# Patient Record
Sex: Female | Born: 1980 | Hispanic: Yes
Health system: Southern US, Community
[De-identification: ages and names within clinical notes are randomized; demographics above are authoritative.]

---

## 2019-07-12 ENCOUNTER — Emergency Department (HOSPITAL_COMMUNITY): Payer: Self-pay

## 2019-07-12 ENCOUNTER — Other Ambulatory Visit: Payer: Self-pay

## 2019-07-12 ENCOUNTER — Emergency Department (HOSPITAL_COMMUNITY)
Admission: EM | Admit: 2019-07-12 | Discharge: 2019-07-12 | Disposition: A | Discharge status: Home / Self Care | Payer: Self-pay | Attending: Emergency Medicine | Admitting: Emergency Medicine

## 2019-07-12 DIAGNOSIS — J1282 Pneumonia due to coronavirus disease 2019: Secondary | ICD-10-CM

## 2019-07-12 DIAGNOSIS — U071 COVID-19: Secondary | ICD-10-CM | POA: Insufficient documentation

## 2019-07-12 DIAGNOSIS — R509 Fever, unspecified: Secondary | ICD-10-CM | POA: Insufficient documentation

## 2019-07-12 DIAGNOSIS — R531 Weakness: Secondary | ICD-10-CM | POA: Insufficient documentation

## 2019-07-12 DIAGNOSIS — J1289 Other viral pneumonia: Secondary | ICD-10-CM | POA: Insufficient documentation

## 2019-07-12 DIAGNOSIS — R0602 Shortness of breath: Secondary | ICD-10-CM | POA: Insufficient documentation

## 2019-07-12 LAB — COMPREHENSIVE METABOLIC PANEL
ALT: 27 U/L (ref 0–44)
AST: 37 U/L (ref 15–41)
Albumin: 3.7 g/dL (ref 3.5–5.0)
Alkaline Phosphatase: 69 U/L (ref 38–126)
Anion gap: 10 (ref 5–15)
BUN: 7 mg/dL (ref 6–20)
CO2: 22 mmol/L (ref 22–32)
Calcium: 8.4 mg/dL — ABNORMAL LOW (ref 8.9–10.3)
Chloride: 105 mmol/L (ref 98–111)
Creatinine, Ser: 0.54 mg/dL (ref 0.44–1.00)
GFR calc Af Amer: >60 mL/min (ref >60)
GFR calc non Af Amer: >60 mL/min (ref >60)
Glucose, Bld: 107 mg/dL — ABNORMAL HIGH (ref 70–99)
Potassium: 3.5 mmol/L (ref 3.5–5.1)
Sodium: 137 mmol/L (ref 135–145)
Total Bilirubin: 0.7 mg/dL (ref 0.3–1.2)
Total Protein: 7.7 g/dL (ref 6.5–8.1)

## 2019-07-12 LAB — CBC WITH DIFFERENTIAL/PLATELET
Abs Immature Granulocytes: 0.03 10*3/uL (ref 0.00–0.07)
Basophils Absolute: 0 10*3/uL (ref 0.0–0.1)
Basophils Relative: 0 %
Eosinophils Absolute: 0 10*3/uL (ref 0.0–0.5)
Eosinophils Relative: 0 %
HCT: 39.4 % (ref 36.0–46.0)
Hemoglobin: 12.9 g/dL (ref 12.0–15.0)
Immature Granulocytes: 0 %
Lymphocytes Relative: 8 %
Lymphs Abs: 0.7 10*3/uL (ref 0.7–4.0)
MCH: 28.9 pg (ref 26.0–34.0)
MCHC: 32.7 g/dL (ref 30.0–36.0)
MCV: 88.3 fL (ref 80.0–100.0)
Monocytes Absolute: 0.3 10*3/uL (ref 0.1–1.0)
Monocytes Relative: 3 %
Neutro Abs: 8.4 10*3/uL — ABNORMAL HIGH (ref 1.7–7.7)
Neutrophils Relative %: 89 %
Platelets: 253 10*3/uL (ref 150–400)
RBC: 4.46 MIL/uL (ref 3.87–5.11)
RDW: 13 % (ref 11.5–15.5)
WBC: 9.5 10*3/uL (ref 4.0–10.5)
nRBC: 0 % (ref 0.0–0.2)

## 2019-07-12 LAB — URINALYSIS, ROUTINE W REFLEX MICROSCOPIC
Bilirubin Urine: NEGATIVE
Glucose, UA: NEGATIVE mg/dL
Hgb urine dipstick: NEGATIVE
Ketones, ur: 5 mg/dL — AB
Leukocytes,Ua: NEGATIVE
Nitrite: NEGATIVE
Protein, ur: NEGATIVE mg/dL
Specific Gravity, Urine: 1.006 (ref 1.005–1.030)
pH: 7 (ref 5.0–8.0)

## 2019-07-12 LAB — POC SARS CORONAVIRUS 2 AG -  ED: SARS Coronavirus 2 Ag: NEGATIVE

## 2019-07-12 LAB — I-STAT BETA HCG BLOOD, ED (MC, WL, AP ONLY): I-stat hCG, quantitative: <5 m[IU]/mL (ref <5)

## 2019-07-12 MED ORDER — SODIUM CHLORIDE 0.9 % IV BOLUS
500.0000 mL | Freq: Once | INTRAVENOUS | Status: AC
  Administered 2019-07-12: 500 mL via INTRAVENOUS

## 2019-07-12 MED ORDER — ALBUTEROL SULFATE HFA 108 (90 BASE) MCG/ACT IN AERS
2.0000 | INHALATION_SPRAY | Freq: Once | RESPIRATORY_TRACT | Status: AC
  Administered 2019-07-12: 23:00:00 2 via RESPIRATORY_TRACT
  Filled 2019-07-12: qty 6.7

## 2019-07-12 MED ORDER — ACETAMINOPHEN 500 MG PO TABS
1000.0000 mg | ORAL_TABLET | Freq: Once | ORAL | Status: AC
  Administered 2019-07-12: 1000 mg via ORAL
  Filled 2019-07-12: qty 2

## 2019-07-12 MED ORDER — PROMETHAZINE-DM 6.25-15 MG/5ML PO SYRP: 5.0000 mL | ORAL_SOLUTION | Freq: Four times a day (QID) | ORAL | 0 refills | Status: AC | PRN

## 2019-07-12 NOTE — ED Notes (Addendum)
Patients SPO2 remained 92% RA while ambulating in room. Patients HR remained 120-130.

## 2019-07-12 NOTE — Discharge Instructions (Addendum)
Toma tylenol e ibuprofen por dolor o fiebre. [place] el albuterol cada 4 horas por los 2 dias que siegue. Despues, [place] solo is necesita por ataques de tos or dificultad con respiracion.  Toma el liquido por tos si necesita.  Continua tomar el amoxicillin  Da Ardelia Mems cita con el doctor para Engineer, civil (consulting).  Regreasa a la sala de emergencia si tiene mas dificultad con respiracion. Regresa con nuevas sintomas o sintomas empeorando.

## 2019-07-12 NOTE — ED Notes (Signed)
Patient was verbalized discharge instructions. Pt had no further questions at this time. NAD. Interpreter used Shirlee Limerick [phone]

## 2019-07-12 NOTE — ED Notes (Signed)
Interpreter used: [id number] Opal Sidles

## 2019-07-12 NOTE — ED Triage Notes (Signed)
Per EMS: Pt is coming from home with c/o severe SOB, cough, and fever. Pt tested negative for covid on 07/09/19 but reports having a covid exposure. Patients lower lobes are diminished and feels hot to touch. Pt denies N/V/D and denies pain at this time.    EMS VITALS: BP 132/88 HR 120 SPO2 91% RA SPO2 97% 3L

## 2019-07-12 NOTE — ED Notes (Signed)
Pt was verbalized discharge instructions. Pt had no further questions at this time. NAD. 

## 2019-07-13 LAB — CBG MONITORING, ED: Glucose-Capillary: 105 mg/dL — ABNORMAL HIGH (ref 70–99)

## 2019-07-13 NOTE — ED Provider Notes (Signed)
[facility]-EMERGENCY DEPT Provider Note   CSN: [id number] Arrival date & time: 07/12/19  1912     History No chief complaint on file.   Alyssa Gallagher is a 38 y.o. female presenting for evaluation of cough and generalized weakness.  Patient states that the past week, she has not been feeling well.  Patient states she has been coughing a lot, and when she has coughing attacks she gets short of breath.  She has also had gradually worsening weakness.  She reports fevers.  Patient states she was tested for coronavirus 2 days ago, was positive.  She denies sick contacts.  Patient states she was also found to have pneumonia, started on amoxicillin 3 days ago.  She was given cough drops for cough, but this has not helped.  She is not taking any other medicine including Tylenol or ibuprofen.  She denies headache, vision changes, sore throat, ear pain, chest pain, shortness of breath when not coughing, nausea, vomiting, abdominal pain, urinary symptoms, abnormal bowel movements.  She denies leg pain or swelling.  She has no other medical problems, takes no medications daily.  HPI     No past medical history on file.  There are no problems to display for this patient.     OB History   No obstetric history on file.     No family history on file.  Social History   Tobacco Use  . Smoking status: Not on file  Substance Use Topics  . Alcohol use: Not on file  . Drug use: Not on file    Home Medications Prior to Admission medications   Medication Sig Start Date End Date Taking? Authorizing Provider  promethazine-dextromethorphan (PROMETHAZINE-DM) 6.25-15 MG/5ML syrup Take 5 mLs by mouth 4 (four) times daily as needed for cough. 07/12/19   Adessa Primiano, PA-C    Allergies    Patient has no known allergies.  Review of Systems   Review of Systems  Constitutional: Positive for fever.  Respiratory: Positive for cough and shortness of breath  (When coughing).   Neurological: Positive for weakness.  All other systems reviewed and are negative.   Physical Exam Updated Vital Signs BP 118/80 (BP Location: Left Arm)   Pulse 99   Temp 100.1 F (37.8 C) (Oral)   Resp 19   SpO2 93%   Physical Exam Vitals and nursing note reviewed.  Constitutional:      General: She is not in acute distress.    Appearance: She is well-developed. She is ill-appearing.     Comments: Appears as if she feels ill, but in no acute distress  HENT:     Head: Normocephalic and atraumatic.  Eyes:     Extraocular Movements: Extraocular movements intact.     Conjunctiva/sclera: Conjunctivae normal.     Pupils: Pupils are equal, round, and reactive to light.  Cardiovascular:     Rate and Rhythm: Regular rhythm. Tachycardia present.     Comments: Tachycardic around 120 Pulmonary:     Effort: Pulmonary effort is normal. No respiratory distress.     Breath sounds: Normal breath sounds. No wheezing.     Comments: Coarse lung sounds, worse on the right side.  Sats stable on room air.  No signs of respiratory distress or accessory muscle use. Abdominal:     General: There is no distension.     Palpations: Abdomen is soft. There is no mass.     Tenderness: There is no abdominal tenderness. There is  no guarding or rebound.  Musculoskeletal:        General: Normal range of motion.     Cervical back: Normal range of motion and neck supple.     Right lower leg: No edema.     Left lower leg: No edema.  Skin:    General: Skin is warm and dry.     Capillary Refill: Capillary refill takes less than 2 seconds.  Neurological:     Mental Status: She is alert and oriented to person, place, and time.     ED Results / Procedures / Treatments   Labs (all labs ordered are listed, but only abnormal results are displayed) Labs Reviewed  CBC WITH DIFFERENTIAL/PLATELET - Abnormal; Notable for the following components:      Result Value   Neutro Abs 8.4 (*)    All  other components within normal limits  COMPREHENSIVE METABOLIC PANEL - Abnormal; Notable for the following components:   Glucose, Bld 107 (*)    Calcium 8.4 (*)    All other components within normal limits  URINALYSIS, ROUTINE W REFLEX MICROSCOPIC - Abnormal; Notable for the following components:   Ketones, ur 5 (*)    All other components within normal limits  CBG MONITORING, ED - Abnormal; Notable for the following components:   Glucose-Capillary 105 (*)    All other components within normal limits  I-STAT BETA HCG BLOOD, ED ([facility], [facility], [facility] ONLY)  POC SARS CORONAVIRUS 2 AG -  ED    EKG None  Radiology DG Chest Portable 1 View  Result Date: 07/12/2019 CLINICAL DATA:  Shortness of breath EXAM: PORTABLE CHEST 1 VIEW COMPARISON:  None. FINDINGS: The heart size and mediastinal contours are within normal limits patchy airspace disease in seen within the right upper lung and periphery of the left upper lung. No pleural effusion. Overall shallow degree of aeration. No acute osseous abnormality. IMPRESSION: Bilateral patchy airspace opacities, left greater than right. This could be due to multifocal pneumonia. Electronically Signed   By: Jonna Clark M.D.   On: 07/12/2019 20:40    Procedures Procedures (including critical care time)  Medications Ordered in ED Medications  acetaminophen (TYLENOL) tablet 1,000 mg (1,000 mg Oral Given 07/12/19 2033)  sodium chloride 0.9 % bolus 500 mL (0 mLs Intravenous Stopped 07/12/19 2113)  albuterol (VENTOLIN HFA) 108 (90 Base) MCG/ACT inhaler 2 puff (2 puffs Inhalation Given 07/12/19 2303)    ED Course  I have reviewed the triage vital signs and the nursing notes.  Pertinent labs & imaging results that were available during my care of the patient were reviewed by me and considered in my medical decision making (see chart for details).    MDM Rules/Calculators/A&P                      Patient presenting for evaluation of cough, shortness of breath,  and generalized weakness.  Physical exam shows patient appears ill, but nontoxic.  She is febrile at 102.6, likely contributing to her tachycardia.  However her sats are stable on room air.  I do not anticipate that she will need admission.  As such, will will hold on calling code sepsis, especially as her symptoms are likely due to coronavirus.  Will obtain labs, chest x-ray, treat fever and reassess.  Labs overall reassuring, no leukocytosis.  Electrolytes stable.  Chest x-ray viewed interpreted by me, shows multifocal pneumonia.  This is likely due to coronavirus.  Rapid Covid was negative, however as  patient continues to have symptoms, will continue to follow coronavirus precautions. Heart rate improved with antipyretics and fluids.  Patient ambulated, did not become hypoxic.  Did become tachycardic at 1 20-1 30.  On reassessment, patient reports she is feeling better.  I discussed patient's tachycardia with ambulation, discussed option of admission if she feels she is not safe to go home due to weakness and tachycardia.  Patient states she would like to go home and continue to treat symptomatically.  She is mostly concerned about her coughing fits.  Will give inhaler for bronchospasm and cough syrup as needed.  Will have patient continue amoxicillin due to multifocal pneumonia read on x-ray, though this is likely viral in nature.  Will have patient follow-up closely with her primary care doctor.  Case discussed with attending, Dr. Alvino Chapel and agrees to plan.  Strict return precautions given, including any worsening respiratory status.  At this time, patient appears safe for discharge.  Patient states she understands and agrees to plan.  Alyssa Gallagher was evaluated in Emergency Department on 07/13/2019 for the symptoms described in the history of present illness. She was evaluated in the context of the global COVID-19 pandemic, which necessitated consideration that the patient might be  at risk for infection with the SARS-CoV-2 virus that causes COVID-19. Institutional protocols and algorithms that pertain to the evaluation of patients at risk for COVID-19 are in a state of rapid change based on information released by regulatory bodies including the CDC and federal and state organizations. These policies and algorithms were followed during the patient's care in the ED.   Final Clinical Impression(s) / ED Diagnoses Final diagnoses:  COVID-19  Pneumonia due to COVID-19 virus    Rx / DC Orders ED Discharge Orders         Ordered    promethazine-dextromethorphan (PROMETHAZINE-DM) 6.25-15 MG/5ML syrup  4 times daily PRN     07/12/19 2254           Franchot Heidelberg, PA-C 07/13/19 0106    Davonna Belling, MD 07/16/19 0800

## 2020-03-05 ENCOUNTER — Other Ambulatory Visit: Payer: Self-pay | Admitting: Nurse Practitioner

## 2020-03-05 ENCOUNTER — Ambulatory Visit
Admission: RE | Admit: 2020-03-05 | Discharge: 2020-03-05 | Disposition: A | Discharge status: Home / Self Care | Payer: No Typology Code available for payment source | Source: Ambulatory Visit | Attending: Nurse Practitioner | Admitting: Nurse Practitioner

## 2020-03-05 DIAGNOSIS — M25472 Effusion, left ankle: Secondary | ICD-10-CM

## 2021-10-14 IMAGING — CR DG ANKLE 2V *L*
2 series · 2 of 2 positions shown · non-contrast
Comparison: None.

CLINICAL DATA: Soft tissue swelling

EXAM:
LEFT ANKLE - 2 VIEW

[x ankle ap left]
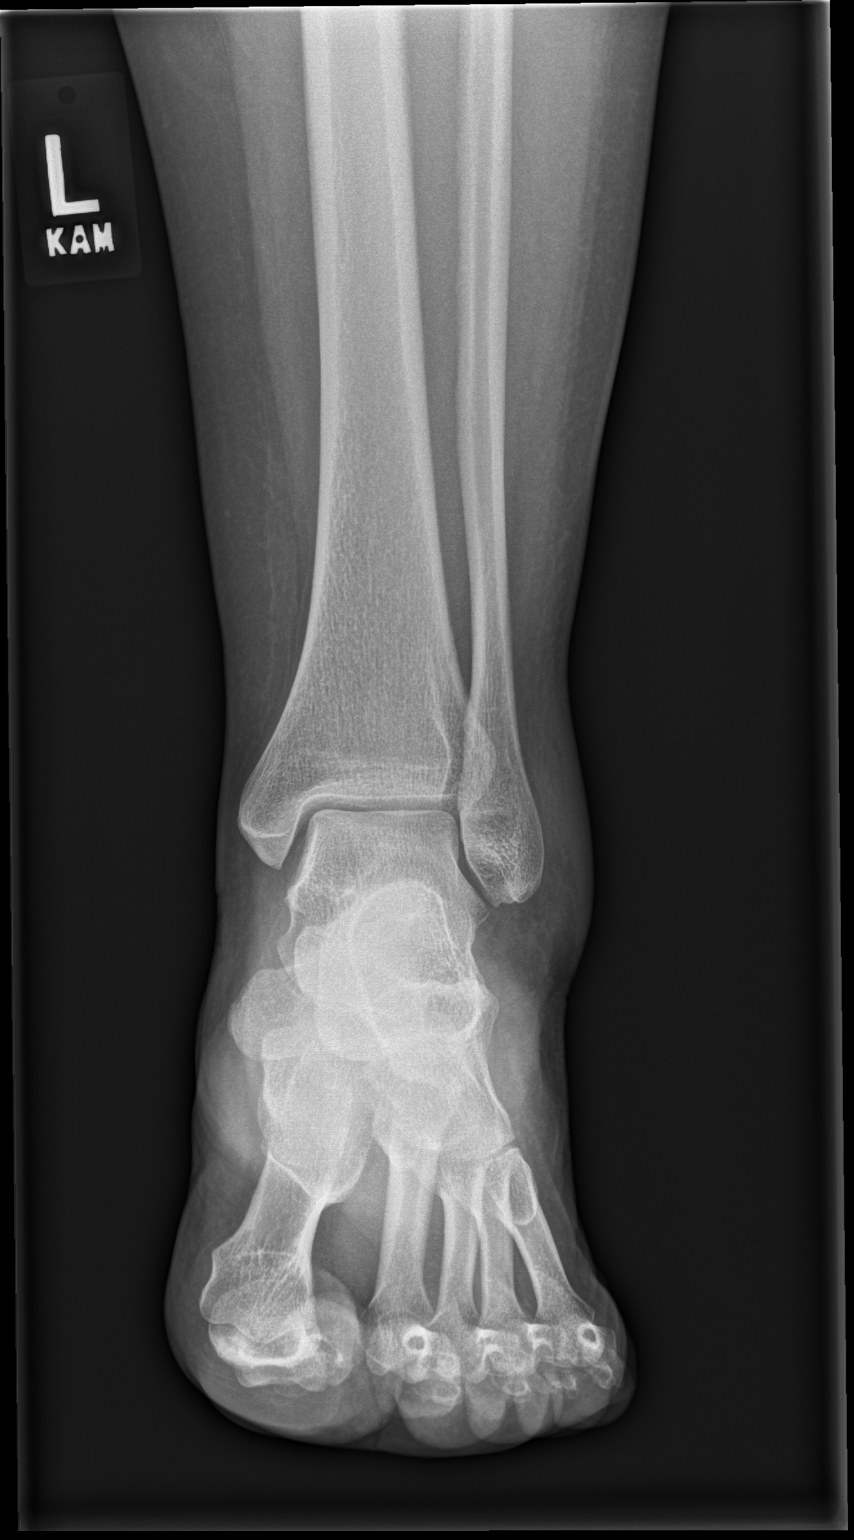

[x ankle lat left]
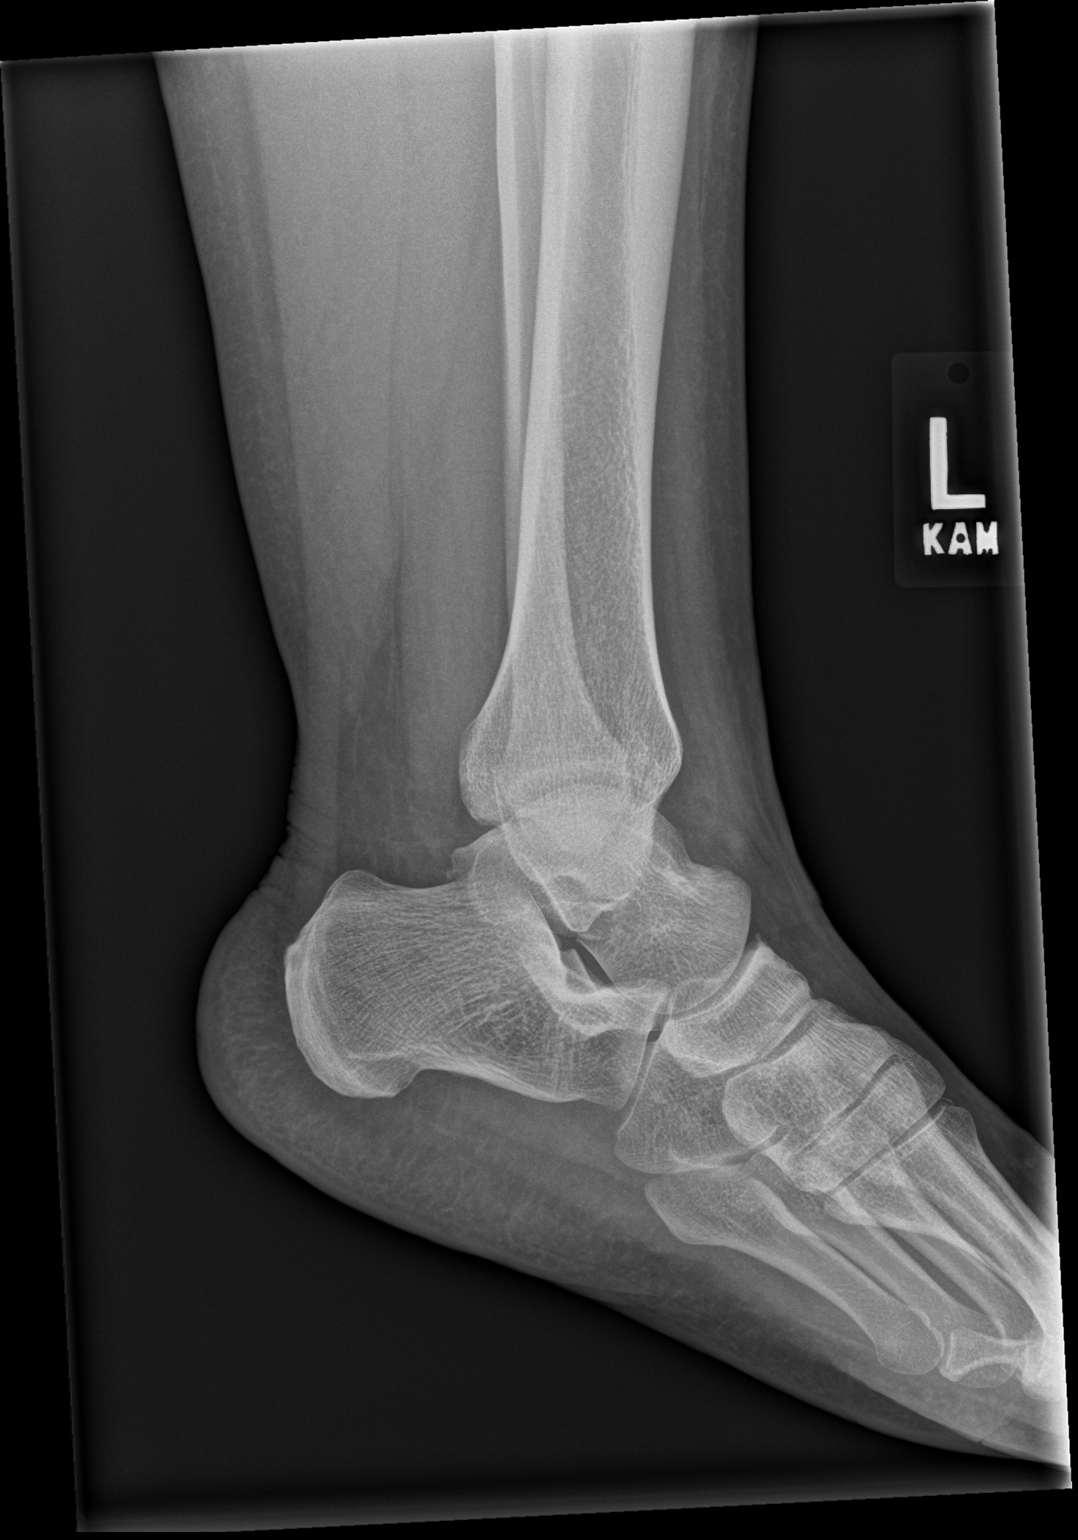

[2 of 2 positions shown; findings below may reference images not displayed]

FINDINGS: Frontal and lateral views obtained. There is soft tissue swelling,
primarily laterally. No evident fracture or joint effusion. No joint
space narrowing or erosion. Ankle mortise appears intact.
IMPRESSION: Soft tissue swelling. No evident fracture or appreciable
arthropathy. Ankle mortise appears intact.
